# Patient Record
Sex: Male | Born: 2000 | Race: Black or African American | Hispanic: No | Marital: Single | Smoking: Former smoker
Health system: Southern US, Community
[De-identification: ages and names within clinical notes are randomized; demographics above are authoritative.]

## PROBLEM LIST (undated history)

## (undated) DIAGNOSIS — G4700 Insomnia, unspecified: Secondary | ICD-10-CM

## (undated) DIAGNOSIS — F419 Anxiety disorder, unspecified: Secondary | ICD-10-CM

## (undated) DIAGNOSIS — E559 Vitamin D deficiency, unspecified: Secondary | ICD-10-CM

## (undated) HISTORY — DX: Insomnia, unspecified: G47.00

## (undated) HISTORY — DX: Anxiety disorder, unspecified: F41.9

## (undated) HISTORY — DX: Vitamin D deficiency, unspecified: E55.9

## (undated) HISTORY — PX: NO PAST SURGERIES: SHX2092

---
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
## 2020-01-26 DIAGNOSIS — L709 Acne, unspecified: Secondary | ICD-10-CM | POA: Diagnosis not present

## 2020-02-25 ENCOUNTER — Encounter: Payer: Self-pay | Admitting: Emergency Medicine

## 2020-02-25 ENCOUNTER — Other Ambulatory Visit: Payer: Self-pay

## 2020-02-25 ENCOUNTER — Emergency Department: Payer: BC Managed Care – PPO

## 2020-02-25 ENCOUNTER — Emergency Department
Admission: EM | Admit: 2020-02-25 | Discharge: 2020-02-25 | Disposition: A | Discharge status: Home / Self Care | Payer: BC Managed Care – PPO | Attending: Student in an Organized Health Care Education/Training Program | Admitting: Student in an Organized Health Care Education/Training Program

## 2020-02-25 DIAGNOSIS — R002 Palpitations: Secondary | ICD-10-CM | POA: Insufficient documentation

## 2020-02-25 DIAGNOSIS — Z87891 Personal history of nicotine dependence: Secondary | ICD-10-CM | POA: Diagnosis not present

## 2020-02-25 DIAGNOSIS — F1290 Cannabis use, unspecified, uncomplicated: Secondary | ICD-10-CM | POA: Insufficient documentation

## 2020-02-25 LAB — URINE DRUG SCREEN, QUALITATIVE (ARMC ONLY)
Amphetamines, Ur Screen: NOT DETECTED
Barbiturates, Ur Screen: NOT DETECTED
Benzodiazepine, Ur Scrn: NOT DETECTED
Cannabinoid 50 Ng, Ur ~~LOC~~: POSITIVE — AB
Cocaine Metabolite,Ur ~~LOC~~: NOT DETECTED
MDMA (Ecstasy)Ur Screen: NOT DETECTED
Methadone Scn, Ur: NOT DETECTED
Opiate, Ur Screen: NOT DETECTED
Phencyclidine (PCP) Ur S: NOT DETECTED
Tricyclic, Ur Screen: NOT DETECTED

## 2020-02-25 LAB — URINALYSIS, COMPLETE (UACMP) WITH MICROSCOPIC
Bacteria, UA: NONE SEEN
Bilirubin Urine: NEGATIVE
Glucose, UA: NEGATIVE mg/dL
Hgb urine dipstick: NEGATIVE
Ketones, ur: NEGATIVE mg/dL
Leukocytes,Ua: NEGATIVE
Nitrite: NEGATIVE
Protein, ur: NEGATIVE mg/dL
Specific Gravity, Urine: 1.021 (ref 1.005–1.030)
Squamous Epithelial / HPF: NONE SEEN (ref 0–5)
pH: 7 (ref 5.0–8.0)

## 2020-02-25 LAB — COMPREHENSIVE METABOLIC PANEL
ALT: 25 U/L (ref 0–44)
AST: 34 U/L (ref 15–41)
Albumin: 4.9 g/dL (ref 3.5–5.0)
Alkaline Phosphatase: 84 U/L (ref 38–126)
Anion gap: 14 (ref 5–15)
BUN: 17 mg/dL (ref 6–20)
CO2: 21 mmol/L — ABNORMAL LOW (ref 22–32)
Calcium: 9.4 mg/dL (ref 8.9–10.3)
Chloride: 106 mmol/L (ref 98–111)
Creatinine, Ser: 0.71 mg/dL (ref 0.61–1.24)
GFR calc Af Amer: >60 mL/min (ref >60)
GFR calc non Af Amer: >60 mL/min (ref >60)
Glucose, Bld: 110 mg/dL — ABNORMAL HIGH (ref 70–99)
Potassium: 3.5 mmol/L (ref 3.5–5.1)
Sodium: 141 mmol/L (ref 135–145)
Total Bilirubin: 1.4 mg/dL — ABNORMAL HIGH (ref 0.3–1.2)
Total Protein: 8.1 g/dL (ref 6.5–8.1)

## 2020-02-25 LAB — CBC
HCT: 47.9 % (ref 39.0–52.0)
Hemoglobin: 15.9 g/dL (ref 13.0–17.0)
MCH: 30.7 pg (ref 26.0–34.0)
MCHC: 33.2 g/dL (ref 30.0–36.0)
MCV: 92.5 fL (ref 80.0–100.0)
Platelets: 199 10*3/uL (ref 150–400)
RBC: 5.18 MIL/uL (ref 4.22–5.81)
RDW: 12.3 % (ref 11.5–15.5)
WBC: 8 10*3/uL (ref 4.0–10.5)
nRBC: 0 % (ref 0.0–0.2)

## 2020-02-25 MED ORDER — SODIUM CHLORIDE 0.9 % IV BOLUS
1000.0000 mL | Freq: Once | INTRAVENOUS | Status: AC
  Administered 2020-02-25: 15:00:00 1000 mL via INTRAVENOUS
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
## 2020-02-25 NOTE — Discharge Instructions (Signed)
Follow-up with your primary care provider if any continued problems.  You will need to call and make an appointment.  Also drink lots of fluids today.  Your palpitations should be improved with increased fluids while in the ED.  Also abstain from smoking which may increase your heart rate.

## 2020-02-25 NOTE — ED Notes (Signed)
Pt states he was not feeling well this morning and was nauseated. When pt got hungry he attempted to eat and had an episode where he felt his heart was racing and became very anxious. Pt denies hx of anxiety or palpitations.

## 2020-02-25 NOTE — ED Triage Notes (Signed)
Pt to ER states was getting ready for work when he felt like his heart was racing.  Pt denies chest pain at the time.  Pt states mild SHOB with episode.  Episode last several minutes.  Pt states it began again and he still feels as if his heart is racing.  HR 92
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
## 2020-07-27 ENCOUNTER — Other Ambulatory Visit: Payer: Self-pay

## 2020-07-27 ENCOUNTER — Other Ambulatory Visit: Payer: BC Managed Care – PPO

## 2020-07-27 DIAGNOSIS — Z20822 Contact with and (suspected) exposure to covid-19: Secondary | ICD-10-CM | POA: Diagnosis not present

## 2020-07-28 LAB — SARS-COV-2, NAA 2 DAY TAT

## 2020-07-28 LAB — NOVEL CORONAVIRUS, NAA: SARS-CoV-2, NAA: NOT DETECTED

## 2020-12-07 IMAGING — CR DG CHEST 2V
1 series · 2 of 2 positions shown · non-contrast
Comparison: No recent.

CLINICAL DATA: Palpitations.

EXAM:
CHEST - 2 VIEW

[Series 1: w chest pa · 0.14mm/px · 2 of 2 slices shown]
[im 1/2]
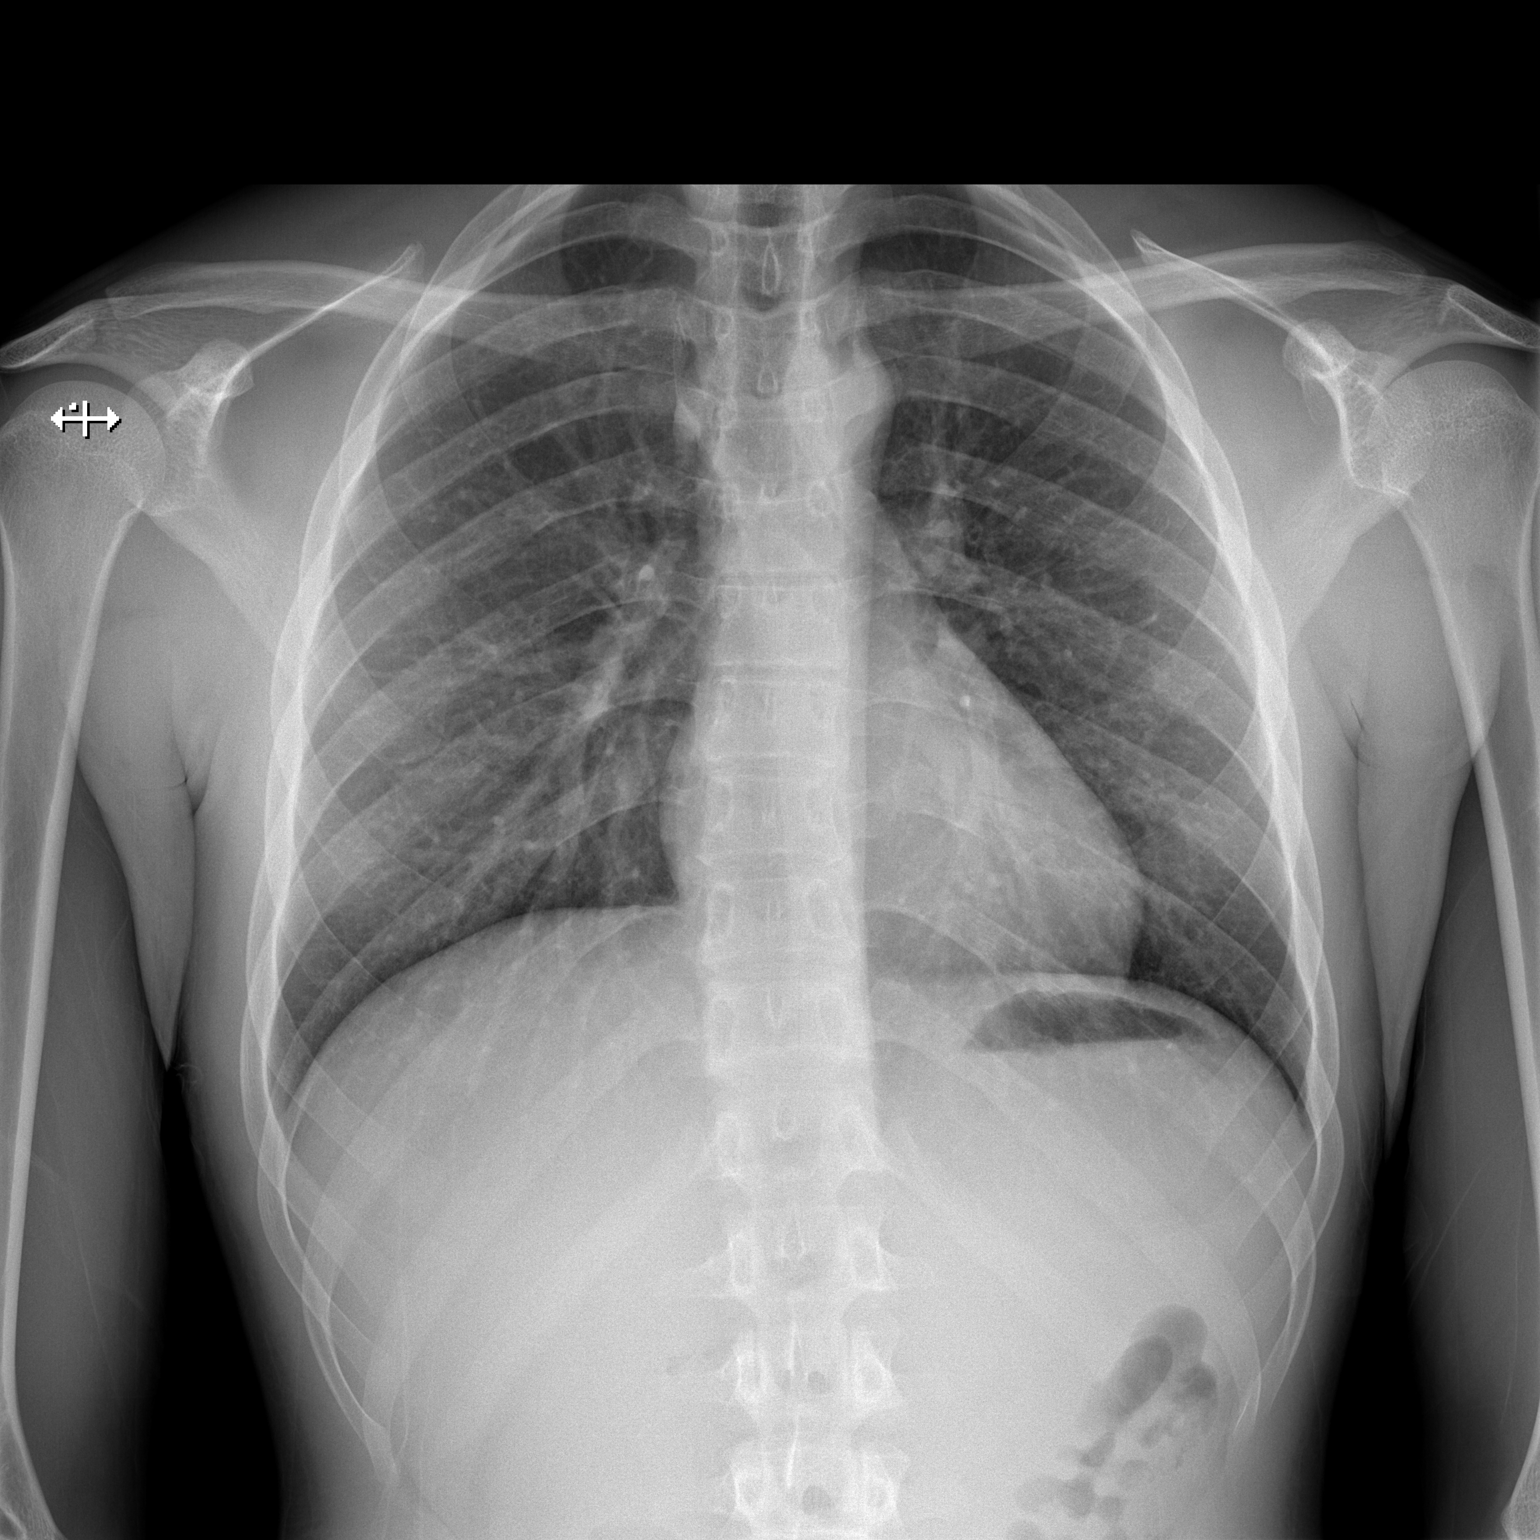
[im 2/2]
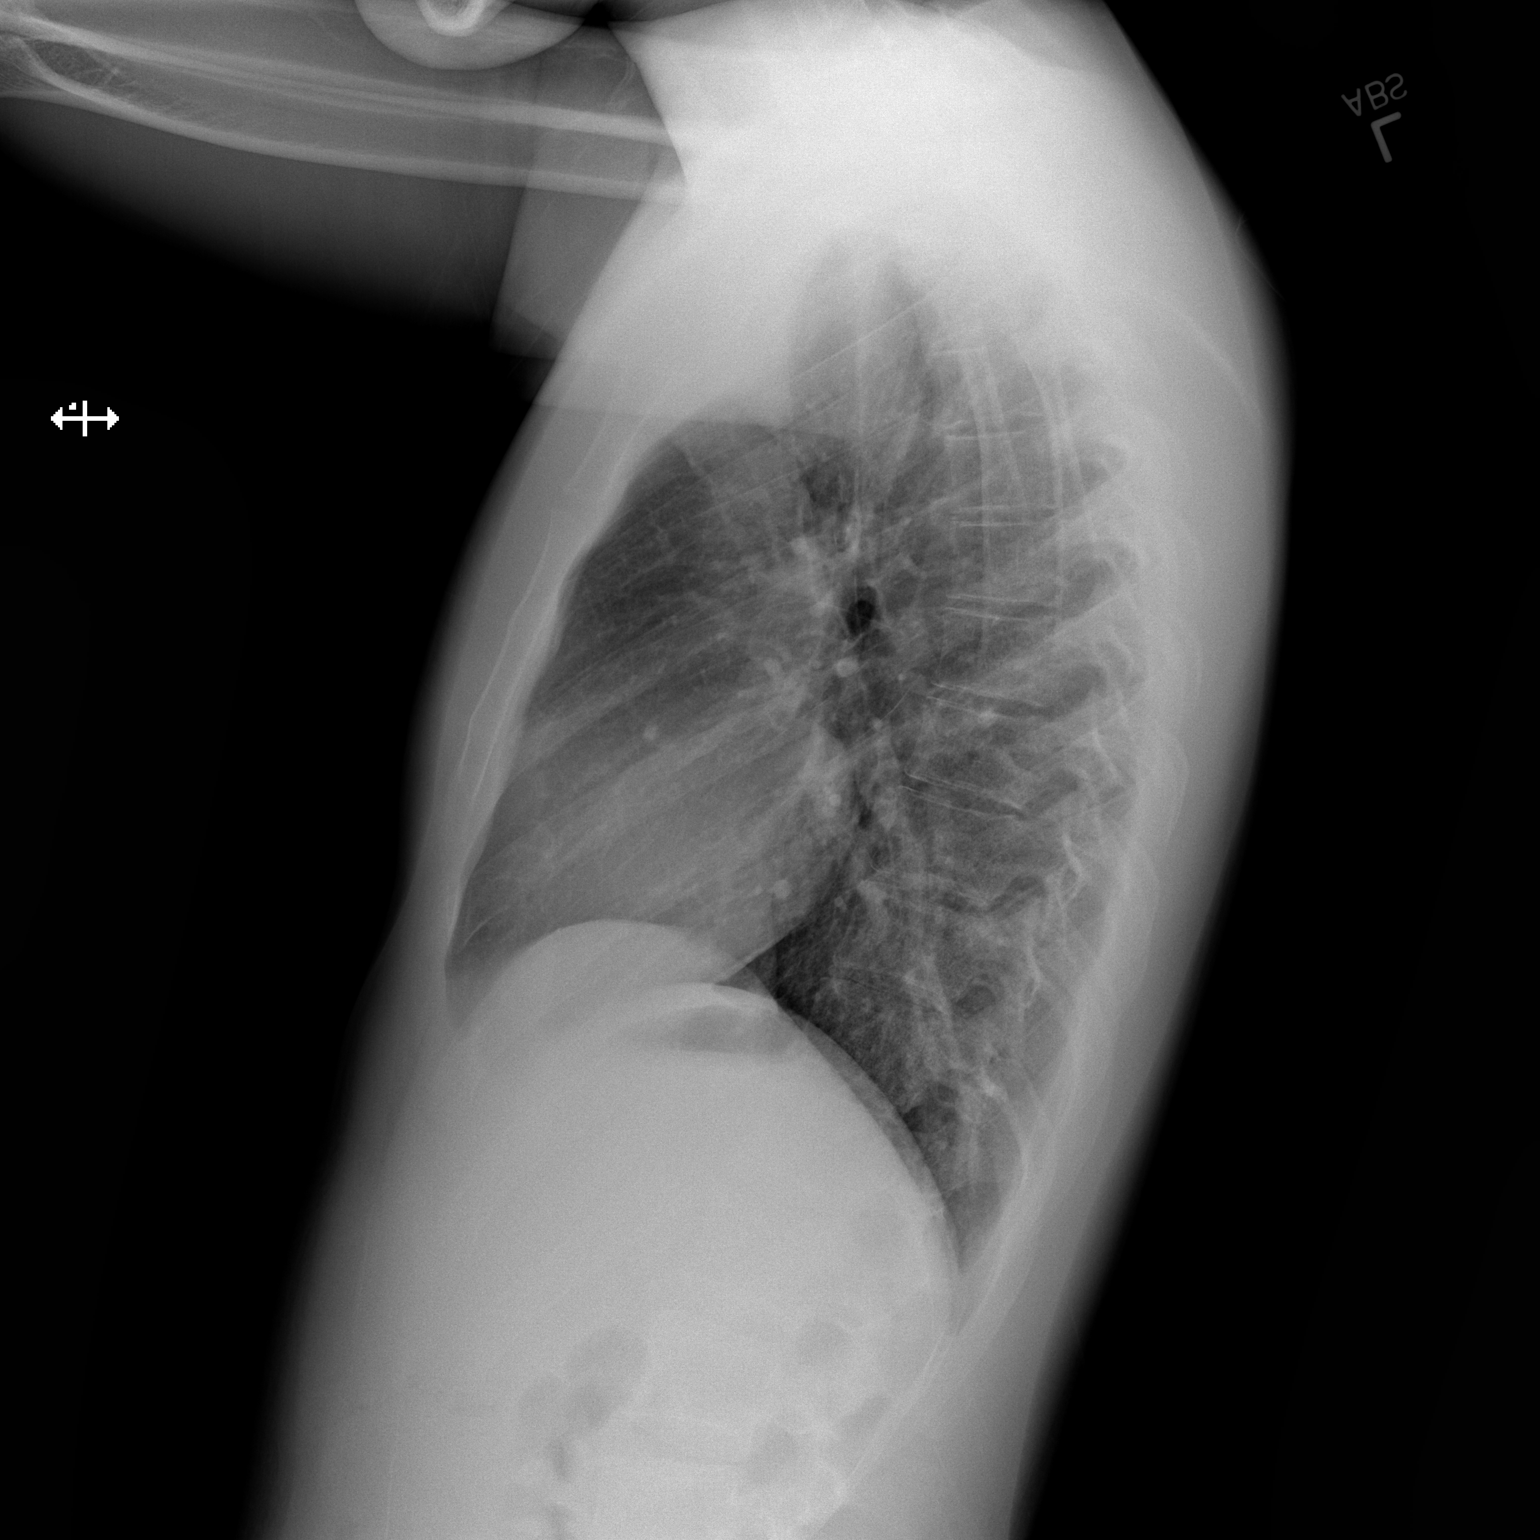

[2 of 2 positions shown; findings below may reference images not displayed]

FINDINGS: Mediastinum and hilar structures normal. Lungs are clear. No pleural
effusion or pneumothorax. Heart size normal. Mild thoracic spine
scoliosis and degenerative change.
IMPRESSION: No acute cardiopulmonary disease.

## 2022-03-12 ENCOUNTER — Other Ambulatory Visit: Payer: Self-pay

## 2022-03-12 ENCOUNTER — Encounter: Payer: Self-pay | Admitting: Internal Medicine

## 2022-03-12 ENCOUNTER — Ambulatory Visit (INDEPENDENT_AMBULATORY_CARE_PROVIDER_SITE_OTHER): Payer: PRIVATE HEALTH INSURANCE | Admitting: Internal Medicine

## 2022-03-12 VITALS — BP 110/70 | HR 79 | Temp 98.3°F | Ht 66.81 in | Wt 159.6 lb

## 2022-03-12 DIAGNOSIS — Z0000 Encounter for general adult medical examination without abnormal findings: Secondary | ICD-10-CM

## 2022-03-12 DIAGNOSIS — Z1329 Encounter for screening for other suspected endocrine disorder: Secondary | ICD-10-CM

## 2022-03-12 DIAGNOSIS — R1084 Generalized abdominal pain: Secondary | ICD-10-CM

## 2022-03-12 DIAGNOSIS — Z23 Encounter for immunization: Secondary | ICD-10-CM | POA: Diagnosis not present

## 2022-03-12 DIAGNOSIS — E739 Lactose intolerance, unspecified: Secondary | ICD-10-CM

## 2022-03-12 DIAGNOSIS — K5900 Constipation, unspecified: Secondary | ICD-10-CM

## 2022-03-12 DIAGNOSIS — G4700 Insomnia, unspecified: Secondary | ICD-10-CM | POA: Diagnosis not present

## 2022-03-12 DIAGNOSIS — E559 Vitamin D deficiency, unspecified: Secondary | ICD-10-CM

## 2022-03-12 DIAGNOSIS — H6123 Impacted cerumen, bilateral: Secondary | ICD-10-CM

## 2022-03-12 DIAGNOSIS — F419 Anxiety disorder, unspecified: Secondary | ICD-10-CM

## 2022-03-12 LAB — TSH: TSH: 2.64 u[IU]/mL (ref 0.35–5.50)

## 2022-03-12 LAB — COMPREHENSIVE METABOLIC PANEL
ALT: 20 U/L (ref 0–53)
AST: 37 U/L (ref 0–37)
Albumin: 4.6 g/dL (ref 3.5–5.2)
Alkaline Phosphatase: 92 U/L (ref 39–117)
BUN: 15 mg/dL (ref 6–23)
CO2: 30 mEq/L (ref 19–32)
Calcium: 9.6 mg/dL (ref 8.4–10.5)
Chloride: 102 mEq/L (ref 96–112)
Creatinine, Ser: 0.78 mg/dL (ref 0.40–1.50)
GFR: 127.97 mL/min (ref >60.00)
Glucose, Bld: 83 mg/dL (ref 70–99)
Potassium: 4.1 mEq/L (ref 3.5–5.1)
Sodium: 138 mEq/L (ref 135–145)
Total Bilirubin: 0.5 mg/dL (ref 0.2–1.2)
Total Protein: 7.5 g/dL (ref 6.0–8.3)

## 2022-03-12 LAB — CBC WITH DIFFERENTIAL/PLATELET
Basophils Absolute: 0 10*3/uL (ref 0.0–0.1)
Basophils Relative: 0.1 % (ref 0.0–3.0)
Eosinophils Absolute: 0.1 10*3/uL (ref 0.0–0.7)
Eosinophils Relative: 1.5 % (ref 0.0–5.0)
HCT: 41.1 % (ref 39.0–52.0)
Hemoglobin: 14.1 g/dL (ref 13.0–17.0)
Lymphocytes Relative: 29.4 % (ref 12.0–46.0)
Lymphs Abs: 1.5 10*3/uL (ref 0.7–4.0)
MCHC: 34.2 g/dL (ref 30.0–36.0)
MCV: 90 fl (ref 78.0–100.0)
Monocytes Absolute: 0.3 10*3/uL (ref 0.1–1.0)
Monocytes Relative: 6.4 % (ref 3.0–12.0)
Neutro Abs: 3.2 10*3/uL (ref 1.4–7.7)
Neutrophils Relative %: 62.6 % (ref 43.0–77.0)
Platelets: 201 10*3/uL (ref 150.0–400.0)
RBC: 4.56 Mil/uL (ref 4.22–5.81)
RDW: 13 % (ref 11.5–14.6)
WBC: 5.1 10*3/uL (ref 4.5–10.5)

## 2022-03-12 LAB — VITAMIN D 25 HYDROXY (VIT D DEFICIENCY, FRACTURES): VITD: 13.92 ng/mL — ABNORMAL LOW (ref 30.00–100.00)

## 2022-03-12 MED ORDER — CHOLECALCIFEROL 1.25 MG (50000 UT) PO CAPS: 50000.0000 [IU] | ORAL_CAPSULE | ORAL | 1 refills | Status: AC

## 2022-03-12 NOTE — Progress Notes (Signed)
Chief Complaint  ?Patient presents with  ? Establish Care  ? Annual Exam  ? ?Annual and re-establish last seen 09/2019 Guse NP  ?1. Anxiety gad 7 score 17 and phq 9 score 13 today and trouble sleeping at times he had to leave the house when other relatives asleep due to his anxiety/sleep issues. He has anxiety about death of loved ones, how hes doing in life  ?Declines meds ok to see therapy for anxiety ? ?2. Resolved abdominal pain he noted with heavy foods he would have abdominal pain and he has stopped that and improved he does like to eat cheese and reports at times constipation  ?3. B/l ear discomfort due to cerumen impaction  ? ? ? ?Review of Systems  ?Constitutional:  Negative for weight loss.  ?HENT:  Negative for hearing loss.   ?Eyes:  Negative for blurred vision.  ?Respiratory:  Negative for shortness of breath.   ?Cardiovascular:  Negative for chest pain.  ?Gastrointestinal:  Negative for abdominal pain and blood in stool.  ?Musculoskeletal:  Negative for back pain.  ?Skin:  Negative for rash.  ?Neurological:  Negative for headaches.  ?Psychiatric/Behavioral:  Negative for depression and suicidal ideas. The patient is nervous/anxious and has insomnia.   ?Past Medical History:  ?Diagnosis Date  ? Anxiety   ? Insomnia   ? Vitamin D deficiency   ? ?Past Surgical History:  ?Procedure Laterality Date  ? NO PAST SURGERIES    ? ?Family History  ?Problem Relation Age of Onset  ? Anxiety disorder Mother   ? Hypertension Maternal Grandmother   ? Anxiety disorder Maternal Grandmother   ? ?Social History  ? ?Socioeconomic History  ? Marital status: Single  ?  Spouse name: Not on file  ? Number of children: Not on file  ? Years of education: Not on file  ? Highest education level: High school graduate  ?Occupational History  ? Occupation: [employer]   ?Tobacco Use  ? Smoking status: Former  ?  Types: E-cigarettes  ? Smokeless tobacco: Never  ? Tobacco comments:  ?  Vapes daily   ?Vaping Use  ? Vaping Use: Every day   ?Substance and Sexual Activity  ? Alcohol use: Yes  ? Drug use: Not Currently  ?  Types: Marijuana  ?  Comment: occasional  ? Sexual activity: Yes  ?Other Topics Concern  ? Not on file  ?Social History Narrative  ? 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 Single but dating same girlfriend x 3 years as of 01/2022  ? ?Social Determinants of Health  ? ?Financial Resource Strain: Not on file  ?Food Insecurity: Not on file  ?Transportation Needs: Not on file  ?Physical Activity: Not on file  ?Stress: Not on file  ?Social Connections: Not on file  ?Intimate Partner Violence: Not on file  ? ?Current Meds  ?Medication Sig  ? Cholecalciferol 1.25 MG (50000 UT) capsule Take 1 capsule (50,000 Units total) by mouth once a week. D3 1x per week x 6 months  ? ?No Known Allergies ?Recent Results (from the past 2160 hour(s))  ?Comprehensive metabolic panel     Status: None  ? Collection Time: 03/12/22  1:42 PM  ?Result Value Ref Range  ? Sodium 138 135 - 145 mEq/L  ? Potassium 4.1 3.5 - 5.1 mEq/L  ? Chloride 102 96 - 112 mEq/L  ? CO2 30 19 - 32 mEq/L  ? Glucose, Bld 83 70 -  99 mg/dL  ? BUN 15 6 - 23 mg/dL  ? Creatinine, Ser 0.78 0.40 - 1.50 mg/dL  ? Total Bilirubin 0.5 0.2 - 1.2 mg/dL  ? Alkaline Phosphatase 92 39 - 117 U/L  ? AST 37 0 - 37 U/L  ? ALT 20 0 - 53 U/L  ? Total Protein 7.5 6.0 - 8.3 g/dL  ? Albumin 4.6 3.5 - 5.2 g/dL  ? GFR 127.97 >60.00 mL/min  ?  Comment: Calculated using the CKD-EPI Creatinine Equation (2021)  ? Calcium 9.6 8.4 - 10.5 mg/dL  ?TSH     Status: None  ? Collection Time: 03/12/22  1:42 PM  ?Result Value Ref Range  ? TSH 2.64 0.35 - 5.50 uIU/mL  ?CBC with Differential/Platelet     Status: None  ? Collection Time: 03/12/22  1:42 PM  ?Result Value Ref Range  ? WBC 5.1 4.5 - 10.5 K/uL  ? RBC 4.56 4.22 - 5.81 Mil/uL  ? Hemoglobin 14.1 13.0 - 17.0 g/dL  ? HCT 41.1 39.0 - 52.0 %  ? MCV 90.0 78.0 - 100.0 fl  ? MCHC 34.2 30.0 - 36.0 g/dL  ? RDW 13.0 11.5 - 14.6 %  ?  Platelets 201.0 150.0 - 400.0 K/uL  ? Neutrophils Relative % 62.6 43.0 - 77.0 %  ? Lymphocytes Relative 29.4 12.0 - 46.0 %  ? Monocytes Relative 6.4 3.0 - 12.0 %  ? Eosinophils Relative 1.5 0.0 - 5.0 %  ? Basophils Relative 0.1 0.0 - 3.0 %  ? Neutro Abs 3.2 1.4 - 7.7 K/uL  ? Lymphs Abs 1.5 0.7 - 4.0 K/uL  ? Monocytes Absolute 0.3 0.1 - 1.0 K/uL  ? Eosinophils Absolute 0.1 0.0 - 0.7 K/uL  ? Basophils Absolute 0.0 0.0 - 0.1 K/uL  ?Vitamin D (25 hydroxy)     Status: Abnormal  ? Collection Time: 03/12/22  1:42 PM  ?Result Value Ref Range  ? VITD 13.92 (L) 30.00 - 100.00 ng/mL  ? ?Objective  ?Body mass index is 25.14 kg/m?. ?Wt Readings from Last 3 Encounters:  ?03/12/22 159 lb 9.6 oz (72.4 kg)  ?02/25/20 145 lb (65.8 kg) (38 %, Z= -0.30)*  ?10/15/19 144 lb 6.4 oz (65.5 kg) (39 %, Z= -0.27)*  ? ?* Growth percentiles are based on CDC (Boys, 2-20 Years) data.  ? ?Temp Readings from Last 3 Encounters:  ?03/12/22 98.3 ?F (36.8 ?C) (Oral)  ?02/25/20 (!) 97.5 ?F (36.4 ?C) (Oral)  ?10/15/19 97.8 ?F (36.6 ?C)  ? ?BP Readings from Last 3 Encounters:  ?03/12/22 110/70  ?02/25/20 134/65  ?10/15/19 104/70  ? ?Pulse Readings from Last 3 Encounters:  ?03/12/22 79  ?02/25/20 89  ?10/15/19 (!) 58  ? ? ?Physical Exam ?Vitals and nursing note reviewed.  ?Constitutional:   ?   Appearance: Normal appearance. He is well-developed and well-groomed.  ?HENT:  ?   Head: Normocephalic and atraumatic.  ?   Right Ear: There is impacted cerumen.  ?   Left Ear: There is impacted cerumen.  ?Eyes:  ?   Conjunctiva/sclera: Conjunctivae normal.  ?   Pupils: Pupils are equal, round, and reactive to light.  ?Cardiovascular:  ?   Rate and Rhythm: Normal rate and regular rhythm.  ?   Heart sounds: Normal heart sounds.  ?Pulmonary:  ?   Effort: Pulmonary effort is normal. No respiratory distress.  ?   Breath sounds: Normal breath sounds.  ?Abdominal:  ?   Tenderness: There is no abdominal tenderness.  ?Skin: ?   General:  Skin is warm and moist.   ?Neurological:  ?   General: No focal deficit present.  ?   Mental Status: He is alert and oriented to person, place, and time. Mental status is at baseline.  ?   Sensory: Sensation is intact.  ?   Motor: Motor function is intact.  ?   Coordination: Coordination is intact.  ?   Gait: Gait is intact. Gait normal.  ?Psychiatric:     ?   Attention and Perception: Attention and perception normal.     ?   Mood and Affect: Mood and affect normal.     ?   Speech: Speech normal.     ?   Behavior: Behavior normal. Behavior is cooperative.     ?   Thought Content: Thought content normal.     ?   Cognition and Memory: Cognition and memory normal.     ?   Judgment: Judgment normal.  ? ? ?Assessment  ?Plan  ?Annual physical exam - Plan: Comprehensive metabolic panel, TSH, Urinalysis, Routine w reflex microscopic, CBC with Differential/Platelet, Vitamin D (25 hydroxy) ?See below  ? ?Vitamin D deficiency - Plan: Vitamin D (25 hydroxy) ?50K weekly x 6 months  ? ? ?Insomnia, unspecified type - Plan: Ambulatory referral to Psychology ?Anxiety - Plan: Ambulatory referral to Psychology [facility] or [facility]  ?If they dont take insurance figure out himself who does and will place referral  ? ?meditation apps on the phone ?Calm, headspace, replika or insight timer  ? ?Stress relax brand Tranquil Sleep [employer] or [employer] 1-2 chewables at night  ? ? ?Resolved ab pain  ?Constipation ?Warm prune juice, water, stool softner colace or laxative milk of magnesia or Miralax powder in 8 ounces  ?R/o Lactose intolerance with ab pain - Plan: Lactose tolerance test ? ?Bilateral impacted cerumen  ?Consented and tolerated b/l ear wax cerumen removal with lavage and b/l currette all wax removed  ?Pt tolerated  ? ?HM ?Flu shot not had 2022  ?Tdap given today  ?No covid vaccines on file  ?Consider std check in the future  ?Labs today  ? ? ?Provider: Dr. French Ana McLean-Scocuzza-Internal Medicine  ?

## 2022-03-12 NOTE — Patient Instructions (Addendum)
You call for therapy appt ?[facility] as given the info before for both  ?[facility] [place]  ?[address]  [address] ?[phone]  ?  ?[facility] [place]  ?[address]  ?[place] [address]  ?[phone]  ? ? ?[facility] for therapy placed referral  ?ocated in: [facility] office center ?Address: [address] [place], [place], [place] [address] ?Hours:  ?Open ? Closes 8?PM ?Phone: [phone] ? ? ?Meditation apps on the phone ? Calm, headspace, replika or insight timer  ? ?Stress relax brand Tranquil Sleep [employer] or [employer] 1-2 chewables at night  ? ?Insomnia ?Insomnia is a sleep disorder that makes it difficult to fall asleep or stay asleep. Insomnia can cause fatigue, low energy, difficulty concentrating, mood swings, and poor performance at work or school. ?There are three different ways to classify insomnia: ?Difficulty falling asleep. ?Difficulty staying asleep. ?Waking up too early in the morning. ?Any type of insomnia can be long-term (chronic) or short-term (acute). Both are common. Short-term insomnia usually lasts for three months or less. Chronic insomnia occurs at least three times a week for longer than three months. ?What are the causes? ?Insomnia may be caused by another condition, situation, or substance, such as: ?Anxiety. ?Certain medicines. ?Gastroesophageal reflux disease (GERD) or other gastrointestinal conditions. ?Asthma or other breathing conditions. ?Restless legs syndrome, sleep apnea, or other sleep disorders. ?Chronic pain. ?Menopause. ?Stroke. ?Abuse of alcohol, tobacco, or illegal drugs. ?Mental health conditions, such as depression. ?Caffeine. ?Neurological disorders, such as Alzheimer's disease. ?An overactive thyroid (hyperthyroidism). ?Sometimes, the cause of insomnia may not be known. ?What increases the risk? ?Risk factors for insomnia include: ?Gender. Women are affected  more often than men. ?Age. Insomnia is more common as you get older. ?Stress. ?Lack of exercise. ?Irregular work schedule or working night shifts. ?Traveling between different time zones. ?Certain medical and mental health conditions. ?What are the signs or symptoms? ?If you have insomnia, the main symptom is having trouble falling asleep or having trouble staying asleep. This may lead to other symptoms, such as: ?Feeling fatigued or having low energy. ?Feeling nervous about going to sleep. ?Not feeling rested in the morning. ?Having trouble concentrating. ?Feeling irritable, anxious, or depressed. ?How is this diagnosed? ?This condition may be diagnosed based on: ?Your symptoms and medical history. Your health care provider may ask about: ?Your sleep habits. ?Any medical conditions you have. ?Your mental health. ?A physical exam. ?How is this treated? ?Treatment for insomnia depends on the cause. Treatment may focus on treating an underlying condition that is causing insomnia. Treatment may also include: ?Medicines to help you sleep. ?Counseling or therapy. ?Lifestyle adjustments to help you sleep better. ?Follow these instructions at home: ?Eating and drinking ? ?Limit or avoid alcohol, caffeinated beverages, and cigarettes, especially close to bedtime. These can disrupt your sleep. ?Do not eat a large meal or eat spicy foods right before bedtime. This can lead to digestive discomfort that can make it hard for you to sleep. ?Sleep habits ? ?Keep a sleep diary to help you and your health care provider figure out what could be causing your insomnia. Write down: ?When you sleep. ?When you wake up during the night. ?How well you sleep. ?How rested you feel the next day. ?Any side effects of medicines you are taking. ?What you eat and drink. ?Make your bedroom a dark, comfortable place where it is easy to fall asleep. ?Put up shades  or blackout curtains to block light from outside. ?Use a white noise machine to block  noise. ?Keep the temperature cool. ?Limit screen use before bedtime. This includes: ?Watching TV. ?Using your smartphone, tablet, or computer. ?Stick to a routine that includes going to bed and waking up at the same times every day and night. This can help you fall asleep faster. Consider making a quiet activity, such as reading, part of your nighttime routine. ?Try to avoid taking naps during the day so that you sleep better at night. ?Get out of bed if you are still awake after 15 minutes of trying to sleep. Keep the lights down, but try reading or doing a quiet activity. When you feel sleepy, go back to bed. ?General instructions ?Take over-the-counter and prescription medicines only as told by your health care provider. ?Exercise regularly, as told by your health care provider. Avoid exercise starting several hours before bedtime. ?Use relaxation techniques to manage stress. Ask your health care provider to suggest some techniques that may work well for you. These may include: ?Breathing exercises. ?Routines to release muscle tension. ?Visualizing peaceful scenes. ?Make sure that you drive carefully. Avoid driving if you feel very sleepy. ?Keep all follow-up visits as told by your health care provider. This is important. ?Contact a health care provider if: ?You are tired throughout the day. ?You have trouble in your daily routine due to sleepiness. ?You continue to have sleep problems, or your sleep problems get worse. ?Get help right away if: ?You have serious thoughts about hurting yourself or someone else. ?If you ever feel like you may hurt yourself or others, or have thoughts about taking your own life, get help right away. You can go to your nearest emergency department or call: ?Your local emergency services (911 in the [place] ?A suicide crisis helpline, such as the National Suicide Prevention Lifeline at 519-096-46621-249-549-6032 or 988 in the [place] This is open 24 hours a day. ?Summary ?Insomnia is a sleep disorder  that makes it difficult to fall asleep or stay asleep. ?Insomnia can be long-term (chronic) or short-term (acute). ?Treatment for insomnia depends on the cause. Treatment may focus on treating an underlying condition that is causing insomnia. ?Keep a sleep diary to help you and your health care provider figure out what could be causing your insomnia. ?This information is not intended to replace advice given to you by your health care provider. Make sure you discuss any questions you have with your health care provider. ?Document Revised: 07/04/2021 Document Reviewed: 10/19/2020 ?Elsevier Patient Education ? 2022 [employer] ? ? ?Managing Anxiety, Adult ?After being diagnosed with anxiety, you may be relieved to know why you have felt or behaved a certain way. You may also feel overwhelmed about the treatment ahead and what it will mean for your life. With care and support, you can manage this condition. ?How to manage lifestyle changes ?Managing stress and anxiety ?Stress is your body's reaction to life changes and events, both good and bad. Most stress will last just a few hours, but stress can be ongoing and can lead to more than just stress. Although stress can play a major role in anxiety, it is not the same as anxiety. Stress is usually caused by something external, such as a deadline, test, or competition. Stress normally passes after the triggering event has ended.  ?Anxiety is caused by something internal, such as imagining a terrible outcome or worrying that something will go wrong that will devastate you. Anxiety often  does not go away even after the triggering event is over, and it can become long-term (chronic) worry. It is important to understand the differences between stress and anxiety and to manage your stress effectively so that it does not lead to an anxious response. ?Talk with your health care provider or a counselor to learn more about reducing anxiety and stress. He or she may suggest  tension reduction techniques, such as: ?Music therapy. Spend time creating or listening to music that you enjoy and that inspires you. ?Mindfulness-based meditation. Practice being aware of your normal brea

## 2022-03-12 NOTE — Progress Notes (Signed)
Patient presented for ear irrigation due to cerumen impaction. Patient was informed of the possible side effects of having their ear flushed; light headedness, dizziness, nausea, vomiting and rupture ear drum. Items sed or that can be used are the elephant pump, catch basin, ear curettes, hydrogen peroxide (half a bottle for ear irrigation solution), and a stool softener (1-2CC for softening ear wax). Patient has given a verbal consent to have ear irrigation. patient voiced no concerns nor showed any signs of distress during procedure. Ear canal has become visibly clear  

## 2022-03-13 ENCOUNTER — Telehealth: Payer: Self-pay | Admitting: Internal Medicine

## 2022-03-13 LAB — LACTOSE TOLERANCE TEST
Glucose, 1 hour: 83 mg/dL
Glucose: 82 mg/dL
Glucose: 82 mg/dL
Glucose: 83 mg/dL
Glucose: 83 mg/dL

## 2022-03-13 LAB — URINALYSIS, ROUTINE W REFLEX MICROSCOPIC
Bilirubin Urine: NEGATIVE
Glucose, UA: NEGATIVE
Hgb urine dipstick: NEGATIVE
Ketones, ur: NEGATIVE
Leukocytes,Ua: NEGATIVE
Nitrite: NEGATIVE
Protein, ur: NEGATIVE
Specific Gravity, Urine: 1.022 (ref 1.001–1.035)
pH: 7.5 (ref 5.0–8.0)

## 2022-03-13 NOTE — Telephone Encounter (Signed)
Tilford Pillar, CMA  ?03/13/2022  2:09 PM EDT Back to Top  ?  ?Left message to return call.  ? Bevelyn Buckles, MD  ?03/13/2022  1:39 PM EDT   ?  ?Vitamin D low  ?-rec D3 1x per week x 6 month sent prescription to [facility] [address]  ?  ?Liver kidneys normal  ?  ?Thyroid lab normal  ?  ?Blood cts normal ?Urine normal ?  ?No lactose intolerance still caution dairy intake if having abdominal pain he can also try pre/probiotics I.e align over the counter daily to see if this helps   ? ?

## 2022-04-05 ENCOUNTER — Telehealth: Payer: Self-pay | Admitting: Internal Medicine

## 2022-04-05 NOTE — Telephone Encounter (Signed)
SEL does not take insurance for therapy  ?Can he call his insurance and get a list of who is covered?  ? ?Or pt can contact below for therapy below and schedule  ? ?[facility] as given the info before for both  ?[facility] [place]  ?[address] [place] [address] ?[phone]  ?  ?[facility] [place]  ?[address]  ?[place] [address]  ?[phone]  ?

## 2022-04-08 ENCOUNTER — Encounter: Payer: Self-pay | Admitting: Internal Medicine
